# Patient Record
Sex: Male | Born: 1995 | Race: White | Hispanic: No | Marital: Single | Smoking: Never smoker
Health system: Southern US, Community
[De-identification: ages and names within clinical notes are randomized; demographics above are authoritative.]

## PROBLEM LIST (undated history)

## (undated) DIAGNOSIS — F419 Anxiety disorder, unspecified: Secondary | ICD-10-CM

## (undated) DIAGNOSIS — R51 Headache: Secondary | ICD-10-CM

## (undated) DIAGNOSIS — F32A Depression, unspecified: Secondary | ICD-10-CM

## (undated) DIAGNOSIS — F329 Major depressive disorder, single episode, unspecified: Secondary | ICD-10-CM

## (undated) HISTORY — DX: Major depressive disorder, single episode, unspecified: F32.9

## (undated) HISTORY — PX: OTHER SURGICAL HISTORY: SHX169

## (undated) HISTORY — DX: Anxiety disorder, unspecified: F41.9

## (undated) HISTORY — DX: Headache: R51

## (undated) HISTORY — DX: Depression, unspecified: F32.A

## (undated) HISTORY — PX: TONSILLECTOMY AND ADENOIDECTOMY: SUR1326

---
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
## 2016-01-21 ENCOUNTER — Ambulatory Visit (HOSPITAL_COMMUNITY): Payer: Self-pay | Admitting: Psychiatry

## 2019-11-14 ENCOUNTER — Ambulatory Visit: Payer: Self-pay | Attending: Internal Medicine
